# Patient Record
Sex: Male | Born: 2000 | Race: White | Hispanic: No | Marital: Single | Smoking: Never smoker
Health system: Southern US, Community
[De-identification: ages and names within clinical notes are randomized; demographics above are authoritative.]

---
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
## 2022-09-25 DIAGNOSIS — R21 Rash and other nonspecific skin eruption: Secondary | ICD-10-CM | POA: Diagnosis not present

## 2022-09-25 DIAGNOSIS — L237 Allergic contact dermatitis due to plants, except food: Secondary | ICD-10-CM | POA: Diagnosis not present

## 2022-11-19 DIAGNOSIS — Z1331 Encounter for screening for depression: Secondary | ICD-10-CM | POA: Diagnosis not present

## 2022-11-19 DIAGNOSIS — Z0000 Encounter for general adult medical examination without abnormal findings: Secondary | ICD-10-CM | POA: Diagnosis not present

## 2022-11-19 DIAGNOSIS — Z23 Encounter for immunization: Secondary | ICD-10-CM | POA: Diagnosis not present

## 2022-11-19 DIAGNOSIS — Z1389 Encounter for screening for other disorder: Secondary | ICD-10-CM | POA: Diagnosis not present

## 2022-11-19 DIAGNOSIS — R82998 Other abnormal findings in urine: Secondary | ICD-10-CM | POA: Diagnosis not present
